# Patient Record
Sex: Female | Born: 1955 | Race: White | Hispanic: No | Marital: Married | Smoking: Never smoker
Health system: Southern US, Community
[De-identification: ages and names within clinical notes are randomized; demographics above are authoritative.]

## PROBLEM LIST (undated history)

## (undated) DIAGNOSIS — F329 Major depressive disorder, single episode, unspecified: Secondary | ICD-10-CM

## (undated) DIAGNOSIS — R51 Headache: Secondary | ICD-10-CM

## (undated) DIAGNOSIS — E119 Type 2 diabetes mellitus without complications: Secondary | ICD-10-CM

## (undated) DIAGNOSIS — F32A Depression, unspecified: Secondary | ICD-10-CM

## (undated) DIAGNOSIS — R519 Headache, unspecified: Secondary | ICD-10-CM

## (undated) HISTORY — DX: Depression, unspecified: F32.A

## (undated) HISTORY — DX: Headache: R51

## (undated) HISTORY — DX: Headache, unspecified: R51.9

## (undated) HISTORY — DX: Type 2 diabetes mellitus without complications: E11.9

## (undated) HISTORY — DX: Major depressive disorder, single episode, unspecified: F32.9

---

## 2006-10-18 HISTORY — PX: BREAST BIOPSY: SHX20

## 2006-10-18 HISTORY — PX: THYROIDECTOMY: SHX17

## 2006-10-18 HISTORY — PX: OTHER SURGICAL HISTORY: SHX169

## 2007-08-09 DIAGNOSIS — G43909 Migraine, unspecified, not intractable, without status migrainosus: Secondary | ICD-10-CM | POA: Insufficient documentation

## 2007-08-09 DIAGNOSIS — C50919 Malignant neoplasm of unspecified site of unspecified female breast: Secondary | ICD-10-CM | POA: Insufficient documentation

## 2007-11-15 DIAGNOSIS — C73 Malignant neoplasm of thyroid gland: Secondary | ICD-10-CM | POA: Insufficient documentation

## 2010-11-18 DIAGNOSIS — E119 Type 2 diabetes mellitus without complications: Secondary | ICD-10-CM | POA: Insufficient documentation

## 2013-09-17 DIAGNOSIS — M8580 Other specified disorders of bone density and structure, unspecified site: Secondary | ICD-10-CM | POA: Insufficient documentation

## 2013-09-17 DIAGNOSIS — N390 Urinary tract infection, site not specified: Secondary | ICD-10-CM | POA: Insufficient documentation

## 2013-09-17 DIAGNOSIS — N952 Postmenopausal atrophic vaginitis: Secondary | ICD-10-CM | POA: Insufficient documentation

## 2013-09-17 DIAGNOSIS — IMO0002 Reserved for concepts with insufficient information to code with codable children: Secondary | ICD-10-CM | POA: Insufficient documentation

## 2014-12-16 ENCOUNTER — Encounter (INDEPENDENT_AMBULATORY_CARE_PROVIDER_SITE_OTHER): Payer: Self-pay

## 2014-12-16 ENCOUNTER — Encounter (HOSPITAL_COMMUNITY): Payer: Self-pay | Admitting: Psychiatry

## 2014-12-16 ENCOUNTER — Ambulatory Visit (INDEPENDENT_AMBULATORY_CARE_PROVIDER_SITE_OTHER): Payer: Federal, State, Local not specified - PPO | Admitting: Psychiatry

## 2014-12-16 VITALS — BP 118/90 | HR 94 | Ht 64.5 in | Wt 165.0 lb

## 2014-12-16 DIAGNOSIS — F331 Major depressive disorder, recurrent, moderate: Secondary | ICD-10-CM

## 2014-12-16 DIAGNOSIS — F0630 Mood disorder due to known physiological condition, unspecified: Secondary | ICD-10-CM

## 2014-12-16 MED ORDER — CITALOPRAM HYDROBROMIDE 20 MG PO TABS: ORAL_TABLET | ORAL | Status: DC
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
## 2014-12-27 ENCOUNTER — Telehealth (HOSPITAL_COMMUNITY): Payer: Self-pay | Admitting: Psychiatry

## 2014-12-27 DIAGNOSIS — F331 Major depressive disorder, recurrent, moderate: Secondary | ICD-10-CM

## 2014-12-30 NOTE — Telephone Encounter (Signed)
PT is going out of town and needs her scripts filled early. Please auth. The med request

## 2014-12-30 NOTE — Telephone Encounter (Signed)
Pt called for a refill for Celexa 20mg .  Pt states she is going out of town and will run out of medication before she returns on 01/18/15. Informed pt. It is too early for medication refill. Will speak w/ Dr. De Nurse for approval.  Per Dr. De Nurse, pt is authorized for a refill of Celexa 20mg , qty 7.  Prescription sent to pharmacy.  Pt has f/u appt on 4/4. Pt states and shows understanding.

## 2014-12-30 NOTE — Telephone Encounter (Signed)
L/M for pt to return call to the office.   Spoke w/ Caryl Pina @[facility]. Per Caryl Pina, it is too early for pt to have a refill for Celexa. Pt's prescription for Celexa was last filled on 2-29.

## 2015-01-14 ENCOUNTER — Telehealth (HOSPITAL_COMMUNITY): Payer: Self-pay | Admitting: *Deleted

## 2015-01-14 DIAGNOSIS — F331 Major depressive disorder, recurrent, moderate: Secondary | ICD-10-CM

## 2015-01-14 MED ORDER — CITALOPRAM HYDROBROMIDE 20 MG PO TABS: 20.0000 mg | ORAL_TABLET | Freq: Every day | ORAL | Status: AC

## 2015-01-14 NOTE — Telephone Encounter (Signed)
Pt called for a refill for Celexa 20mg . Per Dr. De Nurse, pt is authorized for a refill for Celexa 20mg , Qty 30. Prescription was sent to [facility].  Called and informed pt of prescription status. Pt verbally states and shows understanding. Pt has f/u appt on 4/4.

## 2015-01-20 ENCOUNTER — Ambulatory Visit (HOSPITAL_COMMUNITY): Payer: Federal, State, Local not specified - PPO | Admitting: Psychiatry

## 2015-02-10 ENCOUNTER — Other Ambulatory Visit (HOSPITAL_COMMUNITY): Payer: Self-pay | Admitting: Psychiatry

## 2016-02-20 ENCOUNTER — Encounter: Payer: Self-pay | Admitting: Emergency Medicine

## 2016-02-20 ENCOUNTER — Emergency Department (INDEPENDENT_AMBULATORY_CARE_PROVIDER_SITE_OTHER): Payer: Federal, State, Local not specified - PPO

## 2016-02-20 ENCOUNTER — Emergency Department
Admission: EM | Admit: 2016-02-20 | Discharge: 2016-02-20 | Disposition: A | Discharge status: Home / Self Care | Payer: Federal, State, Local not specified - PPO | Source: Home / Self Care | Attending: Emergency Medicine | Admitting: Emergency Medicine

## 2016-02-20 DIAGNOSIS — S52591A Other fractures of lower end of right radius, initial encounter for closed fracture: Secondary | ICD-10-CM | POA: Diagnosis not present

## 2016-02-20 DIAGNOSIS — W19XXXA Unspecified fall, initial encounter: Secondary | ICD-10-CM

## 2016-02-20 DIAGNOSIS — S52531A Colles' fracture of right radius, initial encounter for closed fracture: Secondary | ICD-10-CM | POA: Diagnosis not present

## 2016-02-20 MED ORDER — HYDROCODONE-ACETAMINOPHEN 5-325 MG PO TABS: 2.0000 | ORAL_TABLET | ORAL | Status: AC | PRN

## 2016-02-20 MED ORDER — KETOROLAC TROMETHAMINE 60 MG/2ML IM SOLN: 60.0000 mg | Freq: Once | INTRAMUSCULAR | Status: DC

## 2016-02-20 NOTE — ED Notes (Signed)
Reports falling and landing on right wrist about 90 minutes ago; guarding wrist/arm.

## 2016-02-20 NOTE — ED Provider Notes (Signed)
CSN: [id number]     Arrival date & time 02/20/16  1702 History   First MD Initiated Contact with Patient 02/20/16 1726     Chief Complaint  Patient presents with  . Wrist Pain   (Consider location/radiation/quality/duration/timing/severity/associated sxs/prior Treatment) Patient is a 60 y.o. female presenting with wrist pain. The history is provided by the patient. No language interpreter was used.  Wrist Pain This is a new problem. The current episode started 1 to 2 hours ago. The problem occurs constantly. The problem has been gradually worsening. Pertinent negatives include no headaches. Nothing aggravates the symptoms. The symptoms are relieved by ice. She has tried rest for the symptoms. The treatment provided no relief.  Pt reports she fell backwards.  Pt caught herself with her hand out.  Pt complains of swelling and pain  Past Medical History  Diagnosis Date  . Depression   . Diabetes mellitus, type II ([place])   . Headache    Past Surgical History  Procedure Laterality Date  . Thyroidectomy  2008  . Back fusion  2008  . Breast biopsy  2008   Family History  Problem Relation Age of Onset  . Dementia Mother   . Depression Mother    Social History  Substance Use Topics  . Smoking status: Never Smoker   . Smokeless tobacco: None  . Alcohol Use: No   OB History    No data available     Review of Systems  Neurological: Negative for headaches.  All other systems reviewed and are negative.   Allergies  Cefdinir and Other  Home Medications   Prior to Admission medications   Medication Sig Start Date End Date Taking? Authorizing Provider  atorvastatin (LIPITOR) 20 MG tablet Take 20 mg by mouth.    Historical Provider, MD  Calcium Carbonate-Vitamin D 600-125 MG-UNIT TABS Take by mouth.    Historical Provider, MD  citalopram (CELEXA) 20 MG tablet Take 1 tablet (20 mg total) by mouth daily. 01/14/15   Merian Capron, MD  eszopiclone Johnnye Sima) 2 MG TABS tablet  06/12/12    Historical Provider, MD  gabapentin (NEURONTIN) 600 MG tablet  12/12/14   Historical Provider, MD  hydrochlorothiazide (HYDRODIURIL) 12.5 MG tablet Take 12.5 mg by mouth.    Historical Provider, MD  HYDROcodone-acetaminophen (NORCO/VICODIN) 5-325 MG tablet Take 2 tablets by mouth every 4 (four) hours as needed. 02/20/16   Fransico Meadow, PA-C  levothyroxine (SYNTHROID, LEVOTHROID) 125 MCG tablet Take 1 tablet Monday-Saturday and 1/2 tablet Sunday 07/30/14   Historical Provider, MD  metFORMIN (GLUMETZA) 500 MG (MOD) 24 hr tablet Take 500 mg by mouth.    Historical Provider, MD  Multiple Vitamin (MULTIVITAMIN) capsule Take 1 capsule by mouth.    Historical Provider, MD  topiramate (TOPAMAX) 50 MG tablet 25 mg. 06/07/12   Historical Provider, MD   Meds Ordered and Administered this Visit   Medications  ketorolac (TORADOL) injection 60 mg (not administered)    BP 162/90 mmHg  Pulse 91  Temp(Src) 98.6 F (37 C) (Oral)  Resp 16  Ht 5\' 7"  (1.702 m)  Wt 155 lb (70.308 kg)  BMI 24.27 kg/m2  SpO2 100% No data found.   Physical Exam  Constitutional: She is oriented to person, place, and time. She appears well-developed and well-nourished.  HENT:  Head: Normocephalic.  Eyes: EOM are normal.  Neck: Normal range of motion.  Pulmonary/Chest: Effort normal.  Abdominal: She exhibits no distension.  Musculoskeletal: She exhibits tenderness.  Swollen right wrist.  Pain with movement,  nv and ns intact,    Neurological: She is alert and oriented to person, place, and time.  Psychiatric: She has a normal mood and affect.  Nursing note and vitals reviewed.   ED Course  Procedures (including critical care time)  Labs Review Labs Reviewed - No data to display  Imaging Review Dg Wrist Complete Right  02/20/2016  CLINICAL DATA:  Post fall today, now with posterior wrist pain, swelling and decreased range of motion. EXAM: RIGHT WRIST - COMPLETE 3+ VIEW COMPARISON:  None. FINDINGS: There is a  minimally displaced fracture involving the distal radial epiphysis with extension to involve the radial carpal and distal radial ulnar joints. Expected adjacent soft tissue swelling and displacement of pronator quadratus fat pad. No radiopaque foreign body. Potential tiny minimally displaced ulnar styloid process avulsion fracture. No additional fractures identified. Joint spaces are preserved. No dislocation. IMPRESSION: 1. Minimally displaced fracture involving distal radial epiphysis with intra-articular extension. 2. Query tiny minimally displaced ulnar styloid process avulsion fracture. Electronically Signed   By: Sandi Mariscal M.D.   On: 02/20/2016 17:35     Visual Acuity Review  Right Eye Distance:   Left Eye Distance:   Bilateral Distance:    Right Eye Near:   Left Eye Near:    Bilateral Near:         MDM  Pt placed in a splint.   Pt advised to follow up with Dr. Amedeo Plenty for evaluation.   Pt advised to call Monday for appointment.   ICE, [facility],    1. Colles' fracture of right radius, closed, initial encounter    Meds ordered this encounter  Medications  . ketorolac (TORADOL) injection 60 mg    Sig:   . HYDROcodone-acetaminophen (NORCO/VICODIN) 5-325 MG tablet    Sig: Take 2 tablets by mouth every 4 (four) hours as needed.    Dispense:  30 tablet    Refill:  0    Order Specific Question:  Supervising Provider    Answer:  Burnett Harry, DAVID [[id number]]   An After Visit Summary was printed and given to the patient.   [place], PA-C 02/20/16 [phone]

## 2016-02-20 NOTE — Discharge Instructions (Signed)
Colles Fracture Colles fracture is a type of broken wrist. It means that your radius bone is broken or cracked. The radius is the bone of your forearm on the same side as your thumb. The forearm is the part of your arm that is between your elbow and your wrist. Your forearm is made up of two bones. The other bone is called the ulna. Often, when the radius is broken, the ulna may also be broken. A cast or splint is used to protect and prevent your injured bone from moving as it heals. CAUSES Common causes of this type of fracture include:  A hard, direct hit to the wrist.  Falling on an outstretched hand.  Trauma, such as a car accident or a fall. RISK FACTORS You may be at higher risk for this type of fracture if:  You participate in contact sports and high-risk sports, such as skiing, biking, and ice skating.  You smoke.  You drink more than three alcoholic beverages per day.  You have low or lowered bone density (osteoporosis or osteopenia).  You are a young child or an older adult.  You are a woman who has gone through menopause.  You have a history of previous fractures.  You are not getting enough calcium or vitamin D. SIGNS AND SYMPTOMS Symptoms of Colles fracture may include tenderness, bruising, and inflammation. Additionally, your wrist may hang in an odd position, appear deformed, and be difficult to move. DIAGNOSIS Diagnosis may include:  Physical exam.  X-ray. TREATMENT Treatment depends on many factors, including the severity of the fracture, your age, and your activity level. Treatment for Colles fracture can be nonsurgical or surgical. Nonsurgical Treatment A cast or a splint may be applied to your wrist if the bone is in a good position. If the fracture is not in a good position, it may be necessary for your health care provider to realign it before applying a cast or a splint. Usually, a cast or a splint will be worn for several weeks. Surgical  Treatment Sometimes, if the fractured bone is severely displaced, surgery is required to help hold it together as it heals. Depending on the fracture, there are a number of options for holding the bone in place while it heals, such as a cast and metal pins. HOME CARE INSTRUCTIONS If You Have a Cast:  Do not stick anything inside the cast to scratch your skin. Doing that increases your risk of infection.  Check the skin around the cast every day. Report any concerns to your health care provider. You may put lotion on dry skin around the edges of the cast. Do not apply lotion to the skin underneath the cast. If You Have a Splint:  Wear it as directed by your health care provider. Remove it only as directed by your health care provider.  Loosen the splint if your fingers become numb and tingle, or if they turn cold and blue. Bathing  Cover the cast or splint with a watertight plastic bag to protect it from water while you bathe or shower. Do not let the cast or splint get wet. Managing Pain, Stiffness, and Swelling  If directed, apply ice to the injured area:  Put ice in a plastic bag.  Place a towel between your skin and the bag.  Leave the ice on for 20 minutes, 2-3 times a day.  Move your fingers often to avoid stiffness and to lessen swelling.  Raise the injured area above the level of  your heart while you are sitting or lying down. Driving  Do not drive or operate heavy machinery while taking pain medicine.  Do not drive while wearing a cast or splint on a hand that you use for driving. Activity  Return to your normal activities as directed by your health care provider. Ask your health care provider what activities are safe for you.  Perform range-of-motion exercises only as directed by your health care provider. Safety  Do not use your injured limb to support your body weight until your health care provider says that you can. General Instructions  Do not put pressure on  any part of the cast or splint until it is fully hardened. This may take several hours.  Keep the cast or splint clean and dry.  Do not use any tobacco products, including cigarettes, chewing tobacco, or electronic cigarettes. Tobacco can delay bone healing. If you need help quitting, ask your health care provider.  Take medicines only as directed by your health care provider.  Keep all follow-up visits as directed by your health care provider. This is important. SEEK MEDICAL CARE IF:  Your cast or splint becomes wet or damaged or suddenly feels too tight.  You have a fever.  You have chills.  You have continued severe pain or more swelling than you did before the cast or splint was put on your wrist. SEEK IMMEDIATE MEDICAL CARE IF:  Your hand or fingernails on the injured arm turn blue or gray, or they feel cold or numb.  You have decreased feeling in the fingers of your injured arm.   This information is not intended to replace advice given to you by your health care provider. Make sure you discuss any questions you have with your health care provider.   Document Released: 10/20/2006 Document Revised: 10/25/2014 Document Reviewed: 05/20/2014 Elsevier Interactive Patient Education [employer].

## 2016-02-22 ENCOUNTER — Telehealth: Payer: Self-pay | Admitting: Emergency Medicine

## 2017-02-10 IMAGING — DX DG WRIST COMPLETE 3+V*R*
4 series · 4 of 4 positions shown · non-contrast
Comparison: None.

CLINICAL DATA: Post fall today, now with posterior wrist pain,
swelling and decreased range of motion.

EXAM:
RIGHT WRIST - COMPLETE 3+ VIEW

[wrist pa]
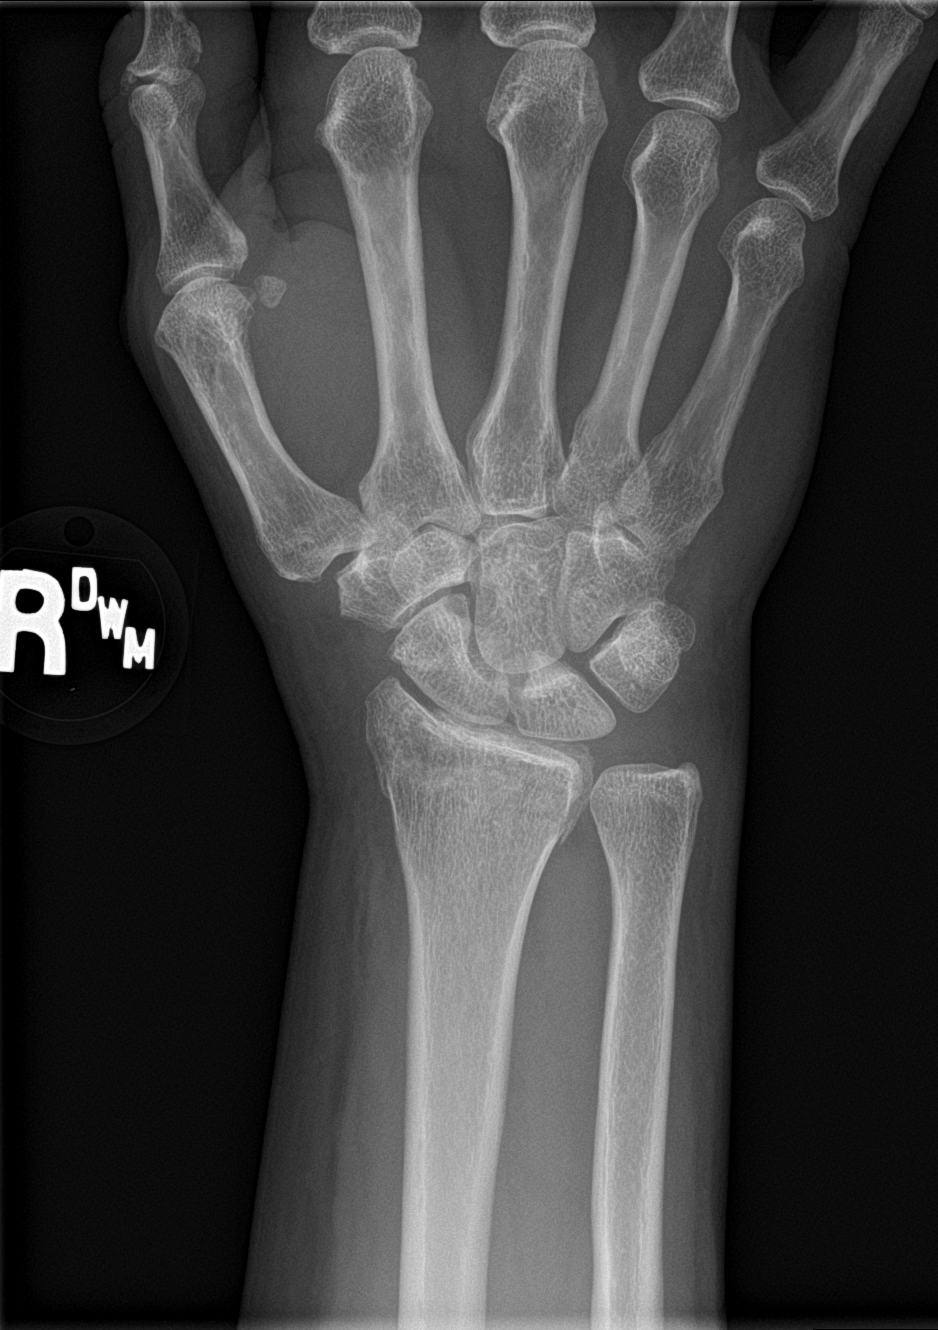

[wrist obl]
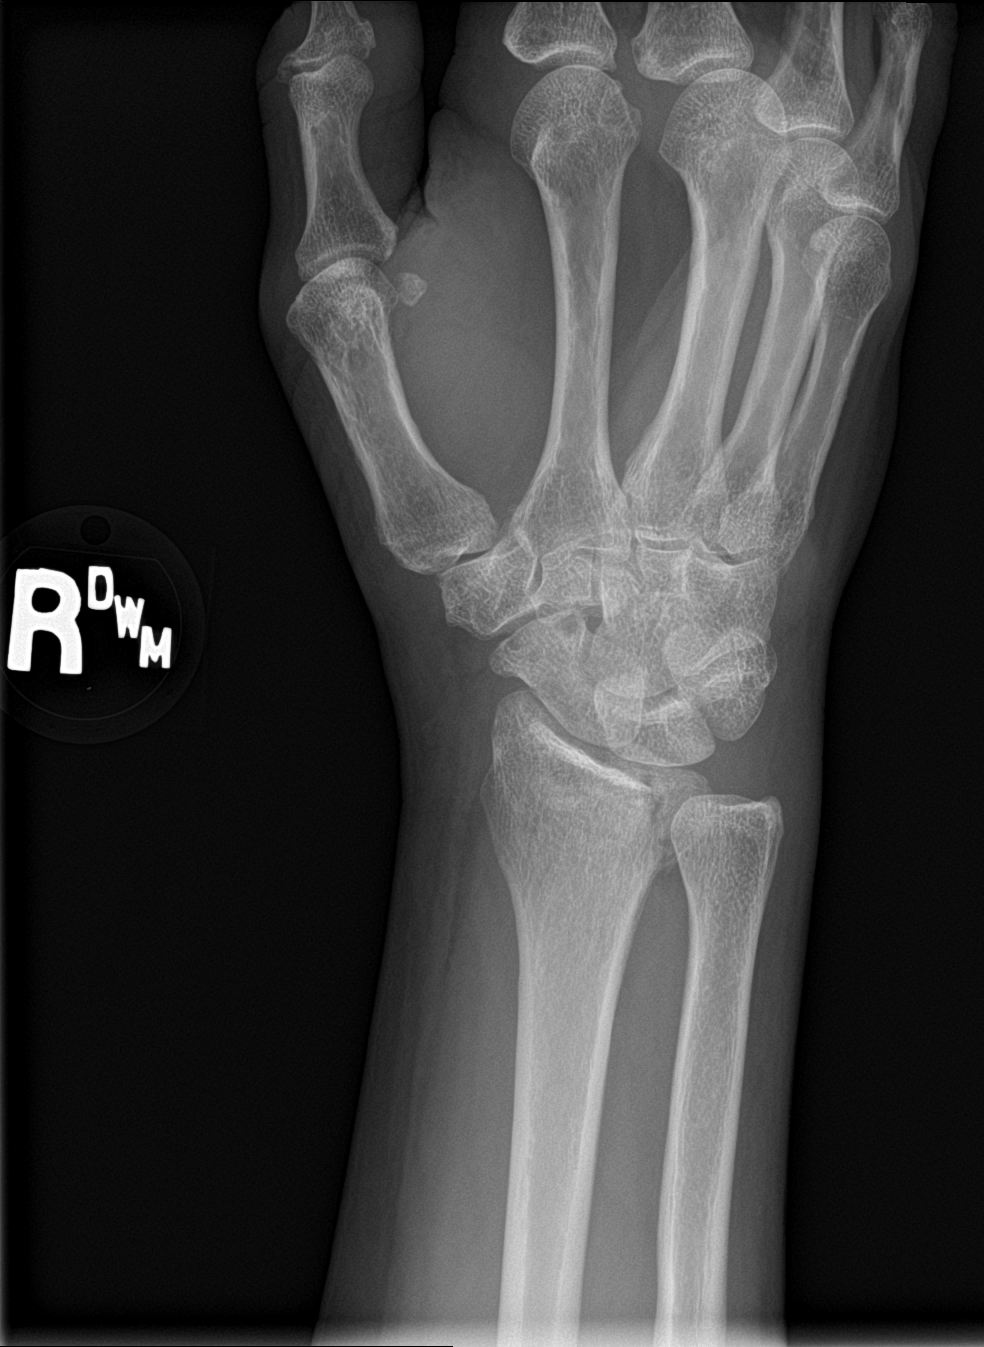

[wrist lat]
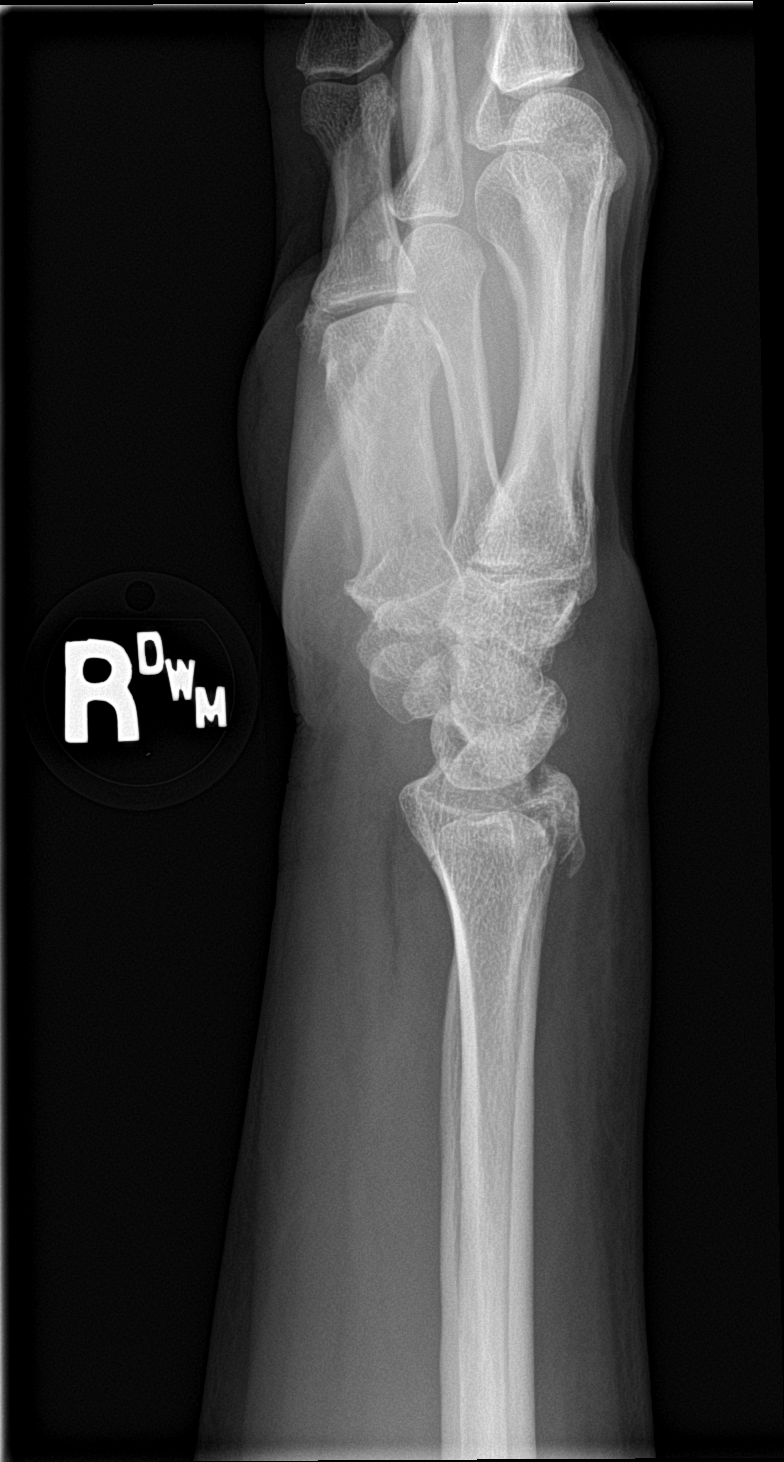

[wrist navicular]
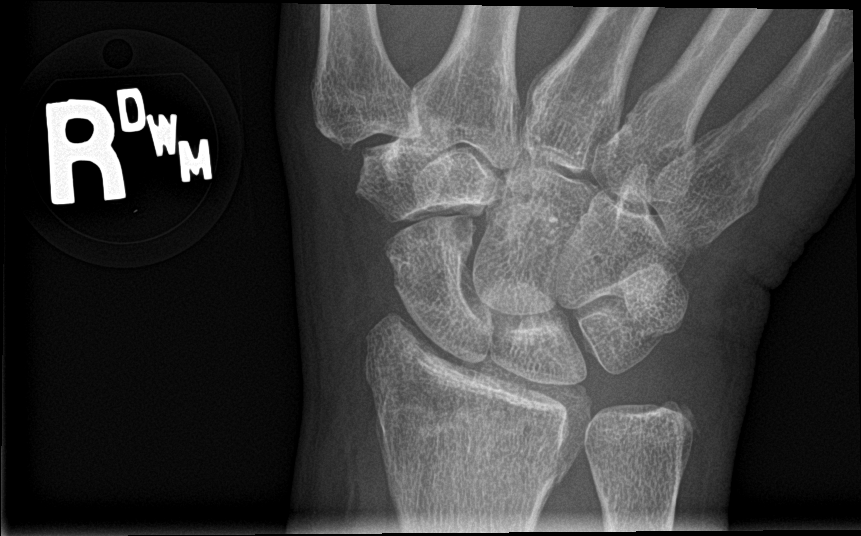

[4 of 4 positions shown; findings below may reference images not displayed]

FINDINGS: There is a minimally displaced fracture involving the distal radial
epiphysis with extension to involve the radial carpal and distal
radial ulnar joints. Expected adjacent soft tissue swelling and
displacement of pronator quadratus fat pad. No radiopaque foreign
body. Potential tiny minimally displaced ulnar styloid process
avulsion fracture.

No additional fractures identified. Joint spaces are preserved. No
dislocation.
IMPRESSION: 1. Minimally displaced fracture involving distal radial epiphysis
with intra-articular extension.
2. Query tiny minimally displaced ulnar styloid process avulsion
fracture.
